# Patient Record
Sex: Male | Born: 1970 | Race: White | Hispanic: No | Marital: Married | Smoking: Current every day smoker
Health system: Southern US, Community
[De-identification: ages and names within clinical notes are randomized; demographics above are authoritative.]

## PROBLEM LIST (undated history)

## (undated) DIAGNOSIS — K635 Polyp of colon: Secondary | ICD-10-CM

## (undated) DIAGNOSIS — E7800 Pure hypercholesterolemia, unspecified: Secondary | ICD-10-CM

## (undated) DIAGNOSIS — I10 Essential (primary) hypertension: Secondary | ICD-10-CM

## (undated) HISTORY — DX: Pure hypercholesterolemia, unspecified: E78.00

## (undated) HISTORY — DX: Polyp of colon: K63.5

## (undated) HISTORY — DX: Essential (primary) hypertension: I10

---

## 2003-08-14 ENCOUNTER — Emergency Department (HOSPITAL_COMMUNITY): Admission: EM | Admit: 2003-08-14 | Discharge: 2003-08-14 | Payer: Self-pay | Admitting: Emergency Medicine

## 2004-01-13 ENCOUNTER — Encounter: Admission: RE | Admit: 2004-01-13 | Discharge: 2004-01-13 | Payer: Self-pay | Admitting: Internal Medicine

## 2005-09-07 ENCOUNTER — Encounter: Admission: RE | Admit: 2005-09-07 | Discharge: 2005-09-07 | Payer: Self-pay | Admitting: Internal Medicine

## 2007-09-20 ENCOUNTER — Encounter: Admission: RE | Admit: 2007-09-20 | Discharge: 2007-09-20 | Payer: Self-pay | Admitting: Internal Medicine

## 2011-05-06 ENCOUNTER — Emergency Department (HOSPITAL_COMMUNITY)
Admission: EM | Admit: 2011-05-06 | Discharge: 2011-05-06 | Disposition: A | Discharge status: Home / Self Care | Payer: No Typology Code available for payment source | Attending: Emergency Medicine | Admitting: Emergency Medicine

## 2011-05-06 ENCOUNTER — Emergency Department (HOSPITAL_COMMUNITY): Payer: No Typology Code available for payment source

## 2011-05-06 DIAGNOSIS — M542 Cervicalgia: Secondary | ICD-10-CM | POA: Insufficient documentation

## 2011-05-06 DIAGNOSIS — R0789 Other chest pain: Secondary | ICD-10-CM | POA: Insufficient documentation

## 2011-05-06 DIAGNOSIS — R51 Headache: Secondary | ICD-10-CM | POA: Insufficient documentation

## 2011-05-18 ENCOUNTER — Other Ambulatory Visit: Payer: Self-pay | Admitting: Internal Medicine

## 2011-05-18 DIAGNOSIS — M542 Cervicalgia: Secondary | ICD-10-CM

## 2011-05-18 DIAGNOSIS — R591 Generalized enlarged lymph nodes: Secondary | ICD-10-CM

## 2011-05-19 ENCOUNTER — Ambulatory Visit
Admission: RE | Admit: 2011-05-19 | Discharge: 2011-05-19 | Disposition: A | Discharge status: Home / Self Care | Payer: BC Managed Care – PPO | Source: Ambulatory Visit | Attending: Internal Medicine | Admitting: Internal Medicine

## 2011-05-19 DIAGNOSIS — R591 Generalized enlarged lymph nodes: Secondary | ICD-10-CM

## 2011-05-19 DIAGNOSIS — M542 Cervicalgia: Secondary | ICD-10-CM

## 2011-05-19 MED ORDER — IOHEXOL 300 MG/ML  SOLN
150.0000 mL | Freq: Once | INTRAMUSCULAR | Status: AC | PRN
  Administered 2011-05-19: 150 mL via INTRAVENOUS

## 2011-11-19 ENCOUNTER — Other Ambulatory Visit: Payer: Self-pay | Admitting: Otolaryngology

## 2011-11-24 ENCOUNTER — Ambulatory Visit
Admission: RE | Admit: 2011-11-24 | Discharge: 2011-11-24 | Disposition: A | Discharge status: Home / Self Care | Payer: BC Managed Care – PPO | Source: Ambulatory Visit | Attending: Otolaryngology | Admitting: Otolaryngology

## 2014-02-10 IMAGING — CT CT PARANASAL SINUSES LIMITED
1 series · 9 of 11 positions shown, 12 images · non-contrast
Comparison: None.

CLINICAL DATA: Nasal congestion, left frontal headache

CT LIMITED SINUSES WITHOUT CONTRAST
TECHNIQUE: Multidetector CT images of the paranasal sinuses were
obtained in a single plane without contrast.

[Series 3: cor soft · axial · 0.35mm/px · z∈[+59,+139]mm · 9 of 11 slices shown, 12 images]
[im 2/11  brain]
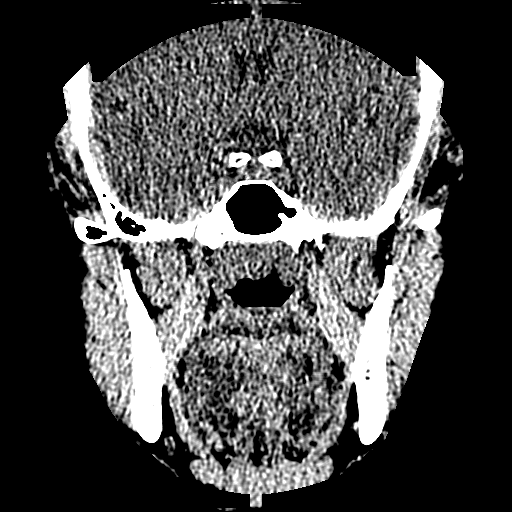
[im 2/11  bone]
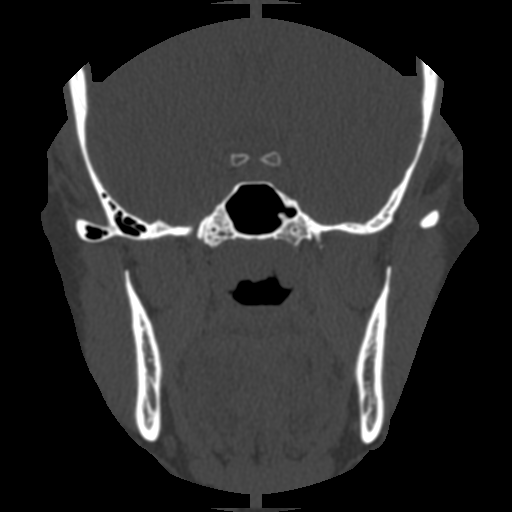
[im 3/11  bone]
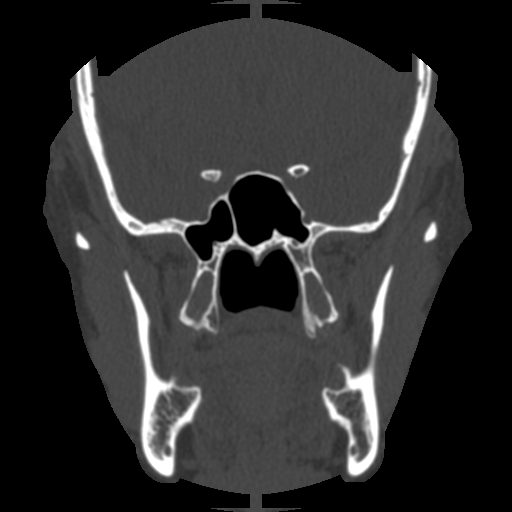
[im 4/11  bone]
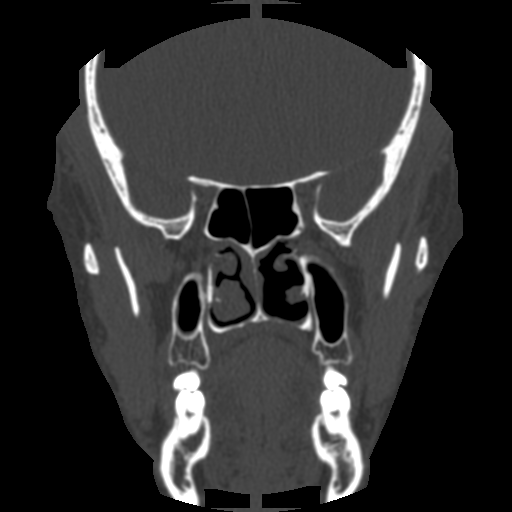
[im 5/11  bone]
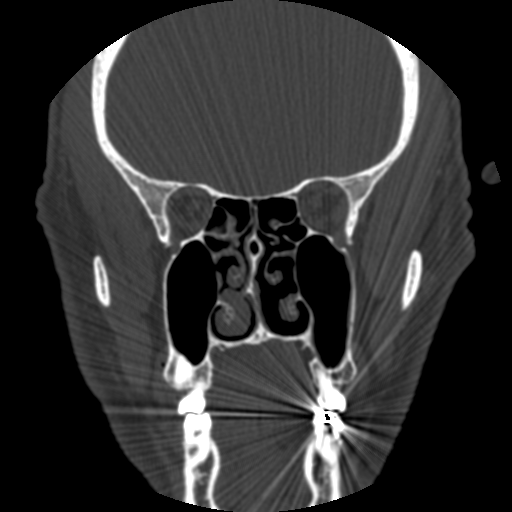
[im 6/11  brain]
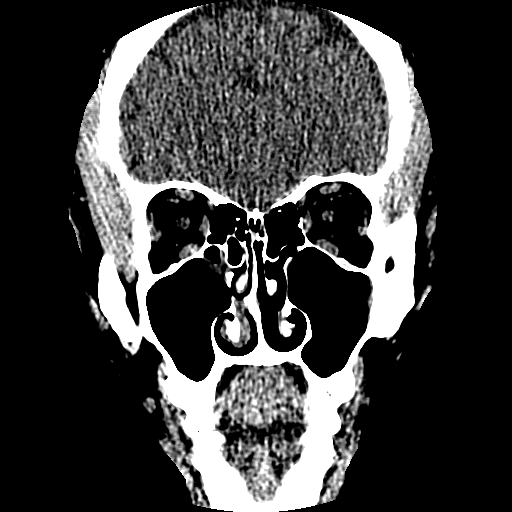
[im 6/11  bone]
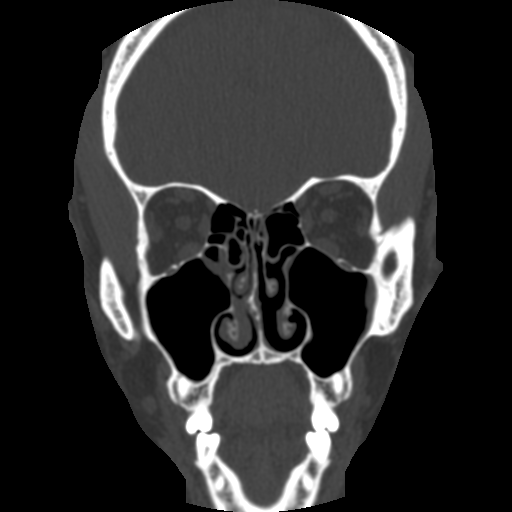
[im 7/11  bone]
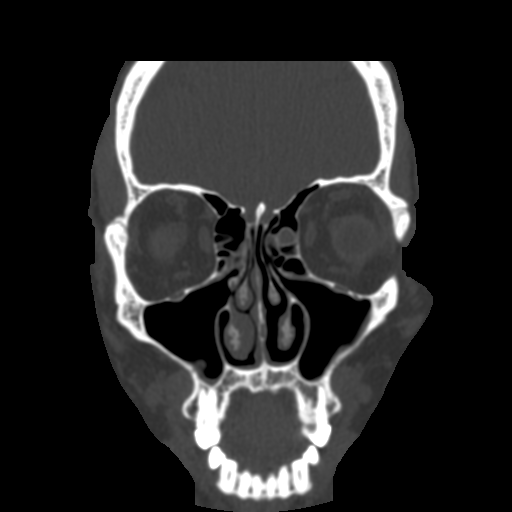
[im 8/11  bone]
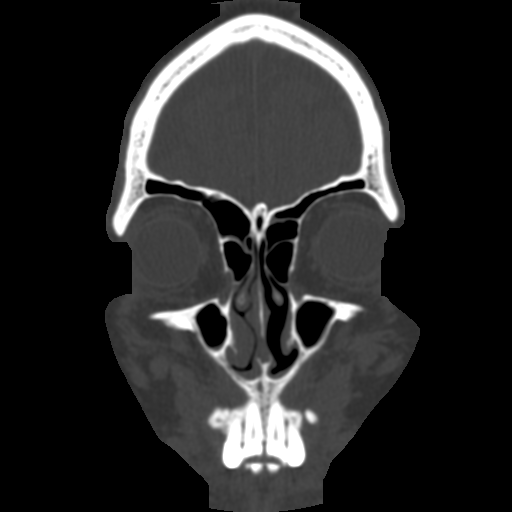
[im 9/11  bone]
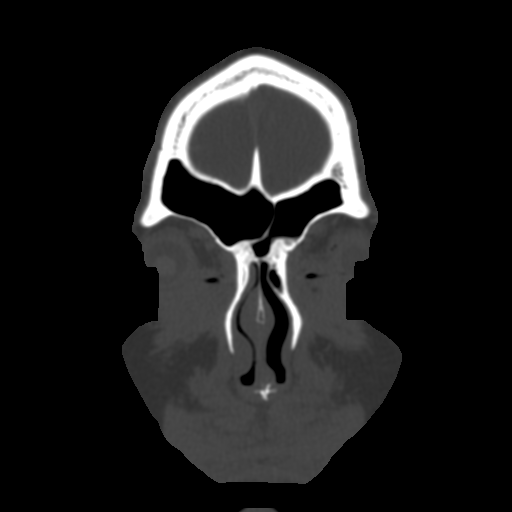
[im 10/11  brain]
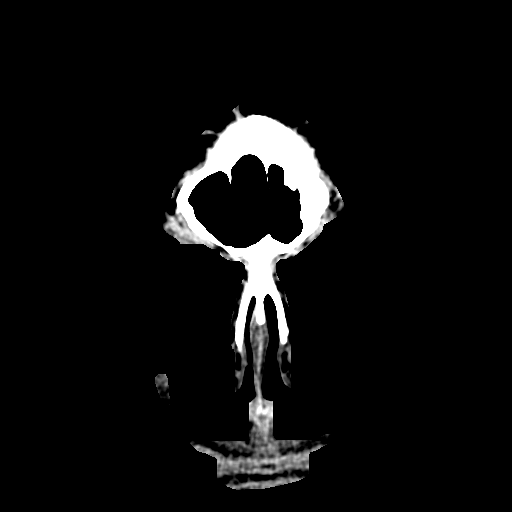
[im 10/11  bone]
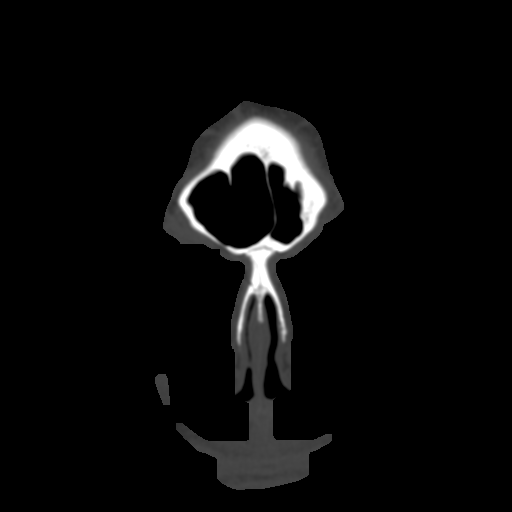

[9 of 11 positions shown; findings below may reference images not displayed]

FINDINGS: Mild mucosal edema in the maxillary and ethmoid sinuses
bilaterally.  Frontal and sphenoid sinuses are clear.  No air-fluid
level.

No acute bony change.  Slight deviation of the nasal septum to the
left.
IMPRESSION: Mild chronic sinusitis.  No air-fluid level.

## 2015-03-10 ENCOUNTER — Other Ambulatory Visit: Payer: Self-pay | Admitting: Internal Medicine

## 2015-03-10 ENCOUNTER — Ambulatory Visit
Admission: RE | Admit: 2015-03-10 | Discharge: 2015-03-10 | Disposition: A | Discharge status: Home / Self Care | Payer: BLUE CROSS/BLUE SHIELD | Source: Ambulatory Visit | Attending: Internal Medicine | Admitting: Internal Medicine

## 2015-03-10 DIAGNOSIS — M79672 Pain in left foot: Secondary | ICD-10-CM

## 2015-03-21 ENCOUNTER — Ambulatory Visit: Payer: Self-pay | Admitting: Podiatry

## 2015-03-27 ENCOUNTER — Ambulatory Visit: Payer: BLUE CROSS/BLUE SHIELD | Admitting: Podiatry

## 2017-05-27 IMAGING — CR DG FOOT 2V*L*
2 series · 2 of 2 positions shown · non-contrast
Comparison: None.

CLINICAL DATA: One year history of lateral left foot pain.

EXAM:
LEFT FOOT - 2 VIEW

[t foot ap left]
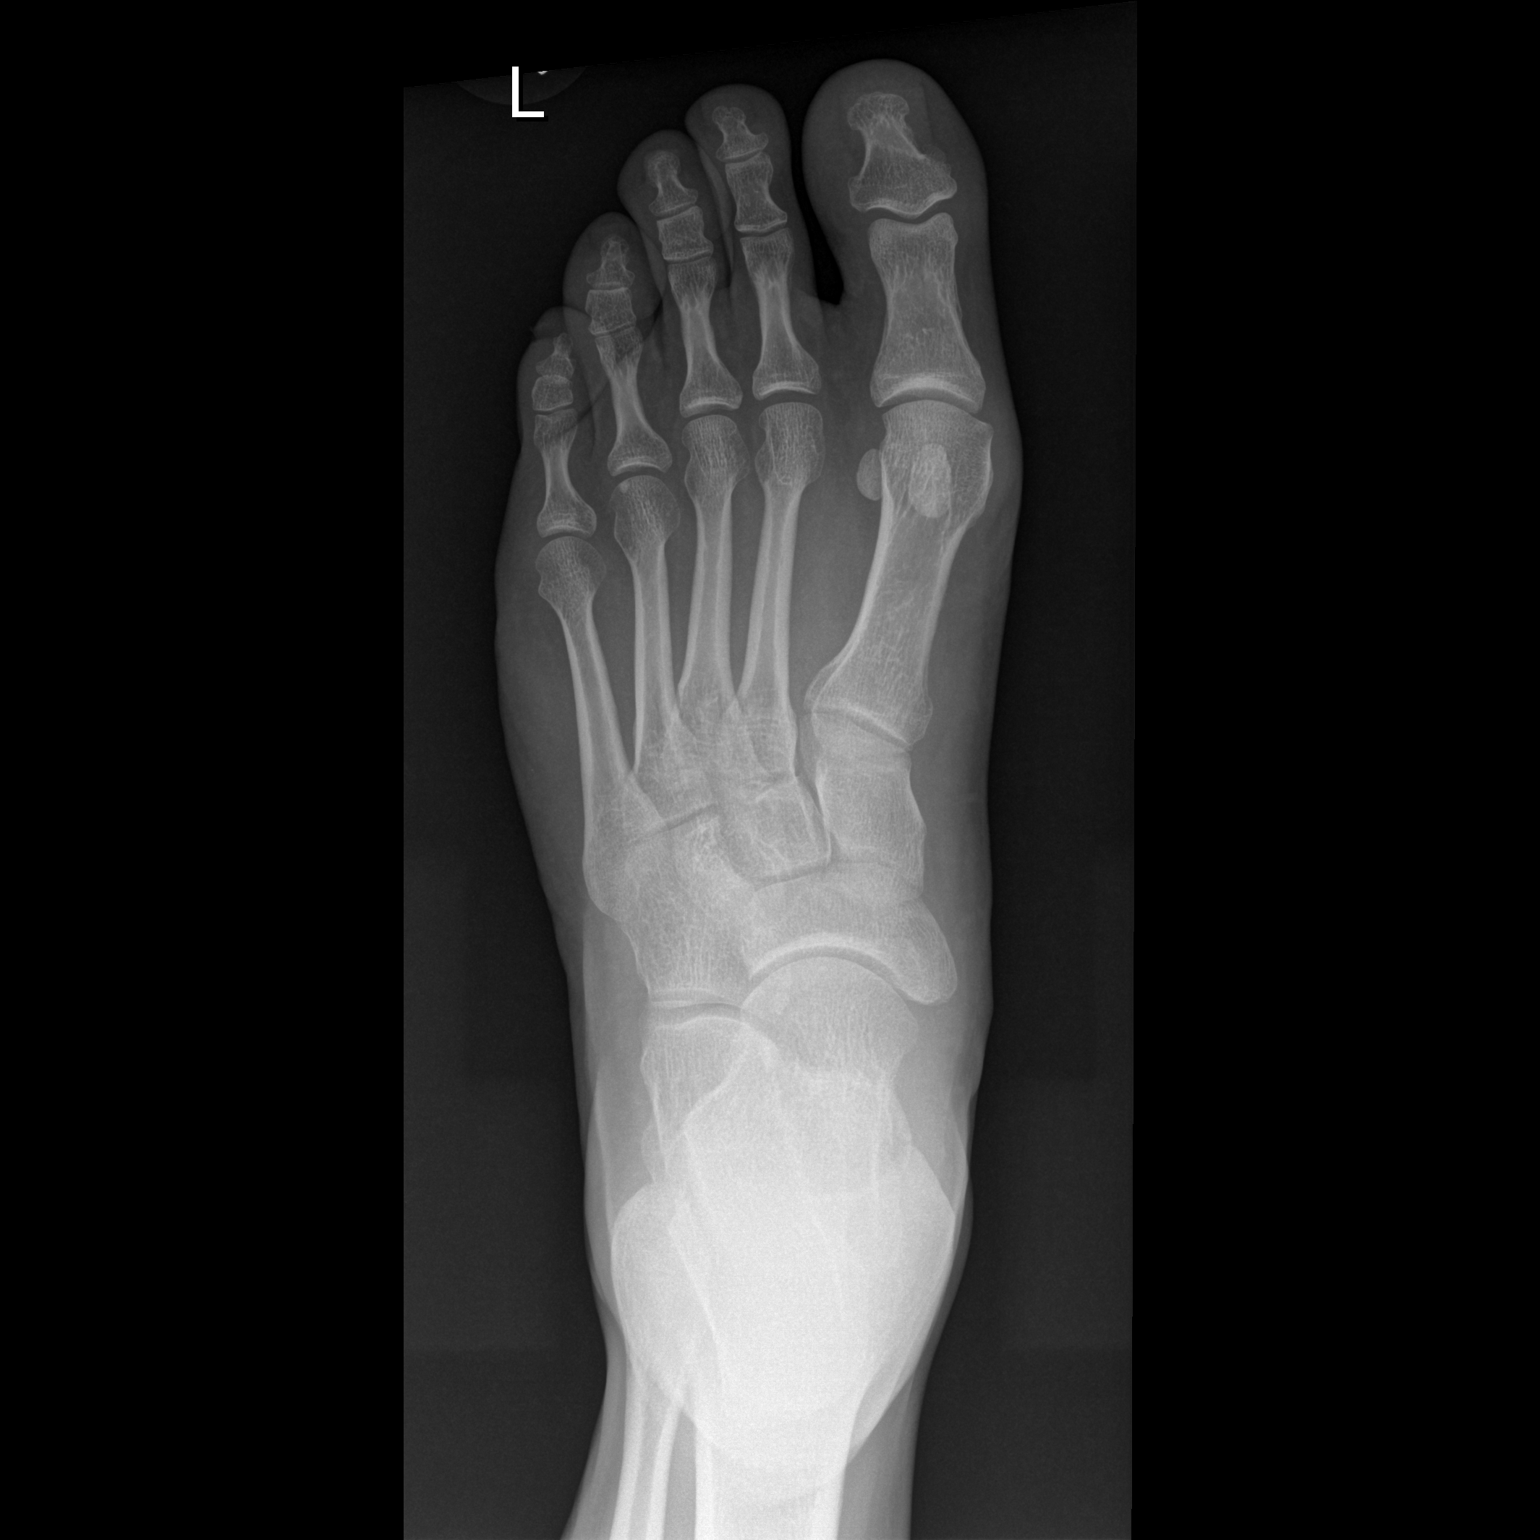

[t foot lat left]
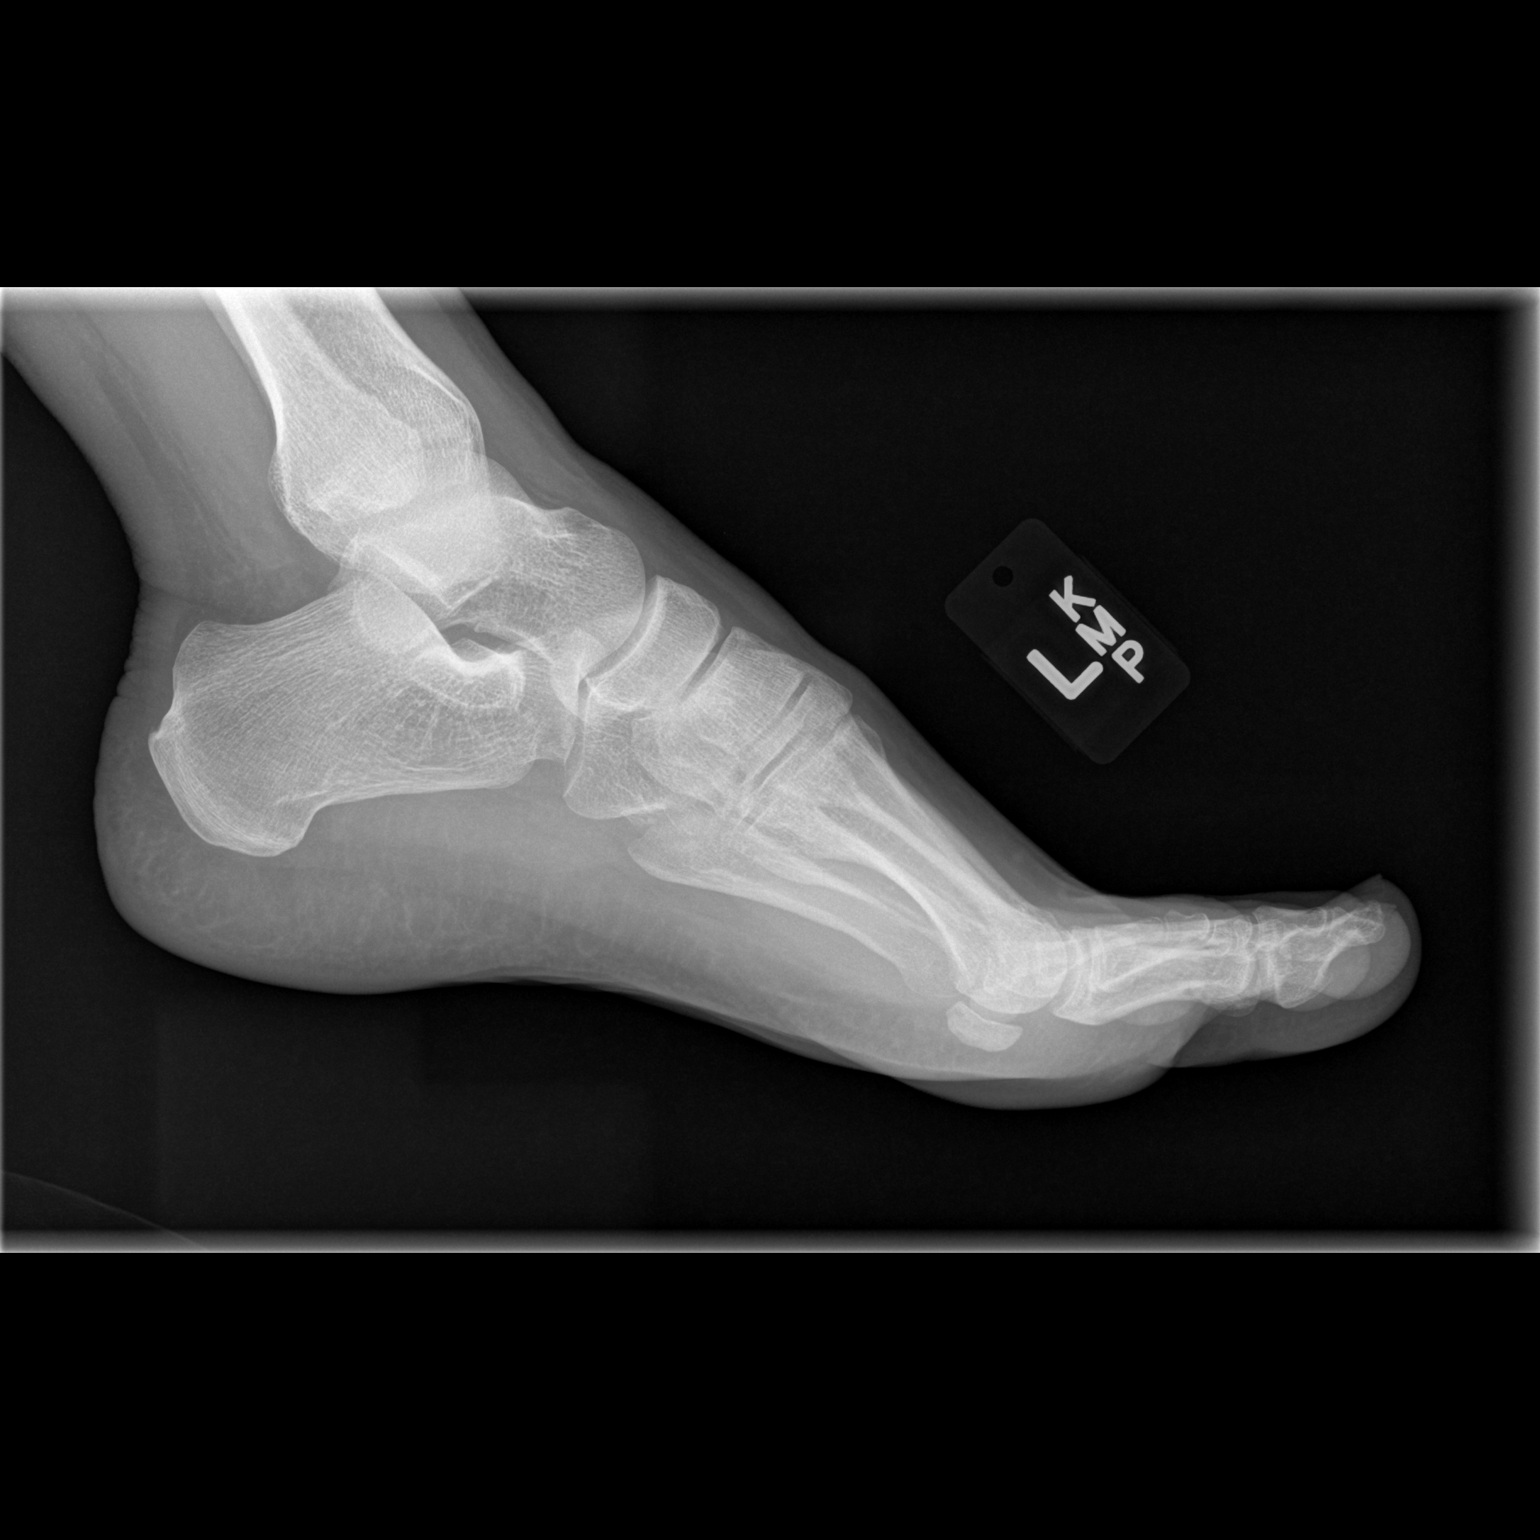

[2 of 2 positions shown; findings below may reference images not displayed]

FINDINGS: The joint spaces are maintained. No acute fracture or osteochondral
abnormality.
IMPRESSION: No acute bony findings or degenerative changes.

## 2021-12-24 LAB — HM COLONOSCOPY

## 2022-03-08 NOTE — Progress Notes (Unsigned)
   New Patient Office Visit  Subjective:  Patient ID: Cory Morgan, male    DOB: Feb 20, 1971  Age: 51 y.o. MRN: [id number]  CC: No chief complaint on file.    HPI Cory Morgan presents to establish care.   No past medical history on file.  *** The histories are not reviewed yet. Please review them in the "History" navigator section and refresh this SmartLink.  No family history on file.  Social History   Socioeconomic History   Marital status: Married    Spouse name: Not on file   Number of children: Not on file   Years of education: Not on file   Highest education level: Not on file  Occupational History   Not on file  Tobacco Use   Smoking status: Not on file   Smokeless tobacco: Not on file  Substance and Sexual Activity   Alcohol use: Not on file   Drug use: Not on file   Sexual activity: Not on file  Other Topics Concern   Not on file  Social History Narrative   Not on file   Social Determinants of Health   Financial Resource Strain: Not on file  Food Insecurity: Not on file  Transportation Needs: Not on file  Physical Activity: Not on file  Stress: Not on file  Social Connections: Not on file  Intimate Partner Violence: Not on file    ROS Review of Systems  Objective:   Today's Vitals: There were no vitals taken for this visit.  Physical Exam  Assessment & Plan:   1. Encounter to establish care ***  2. Essential hypertension ***   No outpatient encounter medications on file as of 03/09/2022.   No facility-administered encounter medications on file as of 03/09/2022.    Follow-up: No follow-ups on file.   Thayer Ohm, DNP, APRN, FNP-BC [facility]

## 2022-03-09 ENCOUNTER — Encounter: Payer: Self-pay | Admitting: Medical-Surgical

## 2022-03-09 ENCOUNTER — Ambulatory Visit (INDEPENDENT_AMBULATORY_CARE_PROVIDER_SITE_OTHER): Payer: 59

## 2022-03-09 ENCOUNTER — Ambulatory Visit (INDEPENDENT_AMBULATORY_CARE_PROVIDER_SITE_OTHER): Payer: 59 | Admitting: Medical-Surgical

## 2022-03-09 VITALS — BP 125/86 | HR 64 | Resp 20 | Ht 69.0 in | Wt 232.8 lb

## 2022-03-09 DIAGNOSIS — K219 Gastro-esophageal reflux disease without esophagitis: Secondary | ICD-10-CM

## 2022-03-09 DIAGNOSIS — Z125 Encounter for screening for malignant neoplasm of prostate: Secondary | ICD-10-CM

## 2022-03-09 DIAGNOSIS — I10 Essential (primary) hypertension: Secondary | ICD-10-CM | POA: Diagnosis not present

## 2022-03-09 DIAGNOSIS — Z7689 Persons encountering health services in other specified circumstances: Secondary | ICD-10-CM | POA: Diagnosis not present

## 2022-03-09 DIAGNOSIS — R053 Chronic cough: Secondary | ICD-10-CM

## 2022-03-09 DIAGNOSIS — Z9109 Other allergy status, other than to drugs and biological substances: Secondary | ICD-10-CM

## 2022-03-09 DIAGNOSIS — E782 Mixed hyperlipidemia: Secondary | ICD-10-CM

## 2022-03-09 DIAGNOSIS — M5431 Sciatica, right side: Secondary | ICD-10-CM

## 2022-03-09 DIAGNOSIS — Z87898 Personal history of other specified conditions: Secondary | ICD-10-CM | POA: Diagnosis not present

## 2022-03-09 DIAGNOSIS — R5383 Other fatigue: Secondary | ICD-10-CM

## 2022-03-09 DIAGNOSIS — R635 Abnormal weight gain: Secondary | ICD-10-CM

## 2022-03-09 LAB — CBC WITH DIFFERENTIAL/PLATELET
MCH: 30 pg (ref 27.0–33.0)
MPV: 11.6 fL (ref 7.5–12.5)
Monocytes Relative: 8.3 %
Platelets: 205 10*3/uL (ref 140–400)
RBC: 5.94 10*6/uL — ABNORMAL HIGH (ref 4.20–5.80)

## 2022-03-09 MED ORDER — LEVOCETIRIZINE DIHYDROCHLORIDE 5 MG PO TABS: 5.0000 mg | ORAL_TABLET | Freq: Every evening | ORAL | 1 refills | Status: DC

## 2022-03-09 MED ORDER — VALSARTAN 40 MG PO TABS: 40.0000 mg | ORAL_TABLET | Freq: Every day | ORAL | 3 refills | Status: DC

## 2022-03-09 MED ORDER — IPRATROPIUM BROMIDE 0.03 % NA SOLN: 2.0000 | Freq: Two times a day (BID) | NASAL | 0 refills | Status: DC

## 2022-03-10 LAB — TESTOSTERONE TOTAL,FREE,BIO, MALES
Albumin: 4.2 g/dL (ref 3.6–5.1)
Sex Hormone Binding: 55 nmol/L — ABNORMAL HIGH (ref 10–50)
Testosterone, Bioavailable: 123.6 ng/dL (ref 110.0–575.0)
Testosterone, Free: 64.2 pg/mL (ref 46.0–224.0)
Testosterone: 701 ng/dL (ref 250–827)

## 2022-03-10 LAB — IRON,TIBC AND FERRITIN PANEL
%SAT: 39 % (calc) (ref 20–48)
Ferritin: 105 ng/mL (ref 38–380)
Iron: 121 ug/dL (ref 50–180)
TIBC: 307 mcg/dL (calc) (ref 250–425)

## 2022-03-10 LAB — TSH: TSH: 2.32 mIU/L (ref 0.40–4.50)

## 2022-03-10 LAB — COMPLETE METABOLIC PANEL WITH GFR
AG Ratio: 1.6 (calc) (ref 1.0–2.5)
ALT: 43 U/L (ref 9–46)
AST: 30 U/L (ref 10–40)
Albumin: 4.2 g/dL (ref 3.6–5.1)
Alkaline phosphatase (APISO): 65 U/L (ref 36–130)
BUN: 13 mg/dL (ref 7–25)
CO2: 25 mmol/L (ref 20–32)
Calcium: 9.4 mg/dL (ref 8.6–10.3)
Chloride: 105 mmol/L (ref 98–110)
Creat: 0.98 mg/dL (ref 0.60–1.29)
Globulin: 2.7 g/dL (calc) (ref 1.9–3.7)
Glucose, Bld: 73 mg/dL (ref 65–139)
Potassium: 4.4 mmol/L (ref 3.5–5.3)
Sodium: 140 mmol/L (ref 135–146)
Total Bilirubin: 0.6 mg/dL (ref 0.2–1.2)
Total Protein: 6.9 g/dL (ref 6.1–8.1)
eGFR: 95 mL/min/{1.73_m2} (ref >=60)

## 2022-03-10 LAB — CBC WITH DIFFERENTIAL/PLATELET
Absolute Monocytes: 672 cells/uL (ref 200–950)
Basophils Absolute: 81 cells/uL (ref 0–200)
Basophils Relative: 1 %
Eosinophils Absolute: 624 cells/uL — ABNORMAL HIGH (ref 15–500)
Eosinophils Relative: 7.7 %
HCT: 52.1 % — ABNORMAL HIGH (ref 38.5–50.0)
Hemoglobin: 17.8 g/dL — ABNORMAL HIGH (ref 13.2–17.1)
Lymphs Abs: 2398 cells/uL (ref 850–3900)
MCHC: 34.2 g/dL (ref 32.0–36.0)
MCV: 87.7 fL (ref 80.0–100.0)
Neutro Abs: 4325 cells/uL (ref 1500–7800)
Neutrophils Relative %: 53.4 %
RDW: 13.3 % (ref 11.0–15.0)
Total Lymphocyte: 29.6 %
WBC: 8.1 10*3/uL (ref 3.8–10.8)

## 2022-03-10 LAB — HEMOGLOBIN A1C
Hgb A1c MFr Bld: 5.6 % of total Hgb (ref <5.7)
Mean Plasma Glucose: 114 mg/dL
eAG (mmol/L): 6.3 mmol/L

## 2022-03-10 LAB — VITAMIN D 25 HYDROXY (VIT D DEFICIENCY, FRACTURES): Vit D, 25-Hydroxy: 33 ng/mL (ref 30–100)

## 2022-03-10 LAB — PSA: PSA: 0.47 ng/mL (ref <=4.00)

## 2022-03-10 LAB — VITAMIN B12: Vitamin B-12: 521 pg/mL (ref 200–1100)

## 2022-03-10 LAB — LIPID PANEL
Cholesterol: 240 mg/dL — ABNORMAL HIGH (ref <200)
HDL: 54 mg/dL (ref >=40)
LDL Cholesterol (Calc): 170 mg/dL (calc) — ABNORMAL HIGH
Non-HDL Cholesterol (Calc): 186 mg/dL (calc) — ABNORMAL HIGH (ref <130)
Total CHOL/HDL Ratio: 4.4 (calc) (ref <5.0)
Triglycerides: 65 mg/dL (ref <150)

## 2022-03-22 ENCOUNTER — Encounter: Payer: Self-pay | Admitting: Medical-Surgical

## 2022-03-23 ENCOUNTER — Encounter: Payer: Self-pay | Admitting: Medical-Surgical

## 2022-03-23 ENCOUNTER — Other Ambulatory Visit: Payer: Self-pay | Admitting: Medical-Surgical

## 2022-03-23 MED ORDER — MELOXICAM 15 MG PO TABS: 15.0000 mg | ORAL_TABLET | Freq: Every day | ORAL | 0 refills | Status: DC

## 2022-04-06 MED ORDER — VALSARTAN 80 MG PO TABS: 40.0000 mg | ORAL_TABLET | Freq: Every day | ORAL | 3 refills | Status: DC

## 2022-04-16 ENCOUNTER — Other Ambulatory Visit: Payer: Self-pay | Admitting: Medical-Surgical

## 2022-04-29 ENCOUNTER — Other Ambulatory Visit: Payer: Self-pay | Admitting: Medical-Surgical

## 2022-04-29 MED ORDER — VALSARTAN 80 MG PO TABS: 80.0000 mg | ORAL_TABLET | Freq: Every day | ORAL | 3 refills | Status: DC

## 2022-04-29 NOTE — Addendum Note (Signed)
Addended bySamuel Bouche on: 04/29/2022 12:46 PM   Modules accepted: Orders

## 2022-05-03 ENCOUNTER — Encounter: Payer: Self-pay | Admitting: Medical-Surgical

## 2022-05-08 ENCOUNTER — Other Ambulatory Visit: Payer: Self-pay | Admitting: Medical-Surgical

## 2022-05-25 ENCOUNTER — Encounter: Payer: Self-pay | Admitting: Medical-Surgical

## 2022-05-25 ENCOUNTER — Ambulatory Visit (INDEPENDENT_AMBULATORY_CARE_PROVIDER_SITE_OTHER): Payer: 59 | Admitting: Medical-Surgical

## 2022-05-25 VITALS — BP 125/86 | HR 72 | Ht 69.0 in

## 2022-05-25 DIAGNOSIS — I10 Essential (primary) hypertension: Secondary | ICD-10-CM | POA: Diagnosis not present

## 2022-05-25 NOTE — Patient Instructions (Signed)
Per Samuel Bouche, NP -  possible that BP cuff could be inaccurate. Recommended that keep record of BP on either original cuff or new cuff - schedule a 2 week f/u  nurse visit and bring this cuff with you to appointment.

## 2022-05-25 NOTE — Progress Notes (Signed)
   Established Patient Office Visit  Subjective   Patient ID: Cory Morgan, male    DOB: Apr 16, 1971  Age: 51 y.o. MRN: [id number]  Chief Complaint  Patient presents with   Hypertension    Nurse visit BP check    HPI  Hypertension - nurse visit - BP check - patient denies  chest pain, shortness of breath, palpitations or dizziness.   ROS    Objective:     BP 125/86   Pulse 72   Ht 5\' 9"  (1.753 m)   SpO2 100%   BMI 34.38 kg/m    Physical Exam   No results found for any visits on 05/25/22.    The 10-year ASCVD risk score (Arnett DK, et al., 2019) is: 9.6%    Assessment & Plan:  Hypertension- blood pressure check  initial reading 136/91- second reading 125/86. Because of patient home readings being elevated -Samuel Bouche, NP -  possible that BP cuff could be inaccurate. Recommended that keep record of BP on either original cuff or new cuff - schedule a 2 week f/u  nurse visit and bring this cuff with you to appointment.  Problem List Items Addressed This Visit   None   Return in about 2 weeks (around 06/08/2022) for nurse visit - BP check .    Rae Lips, LPN

## 2022-05-25 NOTE — Progress Notes (Signed)
Agree with documentation as below.  ___________________________________________ Muneer Leider L. Aliviah Spain, DNP, APRN, FNP-BC Primary Care and Sports Medicine [facility]  

## 2022-06-02 ENCOUNTER — Ambulatory Visit: Payer: 59

## 2022-06-14 ENCOUNTER — Other Ambulatory Visit: Payer: Self-pay | Admitting: Medical-Surgical

## 2022-07-07 ENCOUNTER — Encounter: Payer: Self-pay | Admitting: Medical-Surgical

## 2022-07-07 MED ORDER — VALSARTAN 80 MG PO TABS: 80.0000 mg | ORAL_TABLET | Freq: Every day | ORAL | 1 refills | Status: DC

## 2022-07-23 ENCOUNTER — Other Ambulatory Visit: Payer: Self-pay | Admitting: Medical-Surgical

## 2022-08-23 ENCOUNTER — Other Ambulatory Visit: Payer: Self-pay | Admitting: Medical-Surgical

## 2022-09-29 ENCOUNTER — Other Ambulatory Visit: Payer: Self-pay | Admitting: Medical-Surgical

## 2022-10-15 ENCOUNTER — Other Ambulatory Visit: Payer: Self-pay | Admitting: Medical-Surgical

## 2022-10-27 ENCOUNTER — Other Ambulatory Visit: Payer: Self-pay | Admitting: Medical-Surgical

## 2022-10-28 ENCOUNTER — Other Ambulatory Visit: Payer: Self-pay | Admitting: Medical-Surgical

## 2022-11-11 ENCOUNTER — Other Ambulatory Visit: Payer: Self-pay | Admitting: Medical-Surgical

## 2022-12-21 ENCOUNTER — Other Ambulatory Visit: Payer: Self-pay | Admitting: Medical-Surgical

## 2022-12-28 ENCOUNTER — Ambulatory Visit (INDEPENDENT_AMBULATORY_CARE_PROVIDER_SITE_OTHER): Payer: 59 | Admitting: Medical-Surgical

## 2022-12-28 ENCOUNTER — Ambulatory Visit: Payer: 59 | Attending: Medical-Surgical

## 2022-12-28 ENCOUNTER — Encounter: Payer: Self-pay | Admitting: Medical-Surgical

## 2022-12-28 VITALS — BP 125/85 | HR 69 | Resp 20 | Ht 69.0 in | Wt 241.1 lb

## 2022-12-28 DIAGNOSIS — M5431 Sciatica, right side: Secondary | ICD-10-CM

## 2022-12-28 DIAGNOSIS — R42 Dizziness and giddiness: Secondary | ICD-10-CM

## 2022-12-28 DIAGNOSIS — R55 Syncope and collapse: Secondary | ICD-10-CM | POA: Diagnosis not present

## 2022-12-28 DIAGNOSIS — I10 Essential (primary) hypertension: Secondary | ICD-10-CM

## 2022-12-28 LAB — CBC WITH DIFFERENTIAL/PLATELET
HCT: 46.4 % (ref 38.5–50.0)
Hemoglobin: 16.1 g/dL (ref 13.2–17.1)
Lymphs Abs: 2487 cells/uL (ref 850–3900)
MCH: 29.9 pg (ref 27.0–33.0)
MCHC: 34.7 g/dL (ref 32.0–36.0)
MCV: 86.1 fL (ref 80.0–100.0)
MPV: 11.1 fL (ref 7.5–12.5)
Neutro Abs: 3973 cells/uL (ref 1500–7800)
Platelets: 191 10*3/uL (ref 140–400)
Total Lymphocyte: 32.3 %

## 2022-12-28 MED ORDER — MELOXICAM 15 MG PO TABS: ORAL_TABLET | ORAL | 3 refills | Status: DC

## 2022-12-28 MED ORDER — VALSARTAN 80 MG PO TABS: 80.0000 mg | ORAL_TABLET | Freq: Every day | ORAL | 3 refills | Status: DC

## 2022-12-28 NOTE — Progress Notes (Signed)
        Established patient visit  History, exam, impression, and plan:  1. Dizziness 2. Near syncope Pleasant 52 year old male presenting today with reports of 3 weeks of intermittent dizziness, fogginess, and feeling "odd".  Approximately 3 weeks ago, had [place]  He was walking over to where the kids were very dizzy and almost fell.  He did not lose consciousness and ended up sitting down on the ground for a while.  At that time, his pulse was good but they did not have a blood pressure cuff available.  He went to urgent care where his blood pressure was slightly elevated with diastolics in the 100s.  An EKG was completed there with normal sinus rhythm, rate of 84 and no acute changes.  Today he reports he has continued having these episodes although not as extensive.  After each 1, he has a dull headache.  The severity of his symptoms usually indicates how bad the headache will be there after.  These episodes are occurring daily and usually last for several seconds before spontaneously resolving.  Not associated with fever, chills, nausea, vomiting, sinus pressure, ear pain, or worsening of baseline tinnitus.  Does not seem to be associated with any particular activity or position.  No worsening of symptoms with lying down or other position changes.  Reports that the symptoms do not feel similar to previous episodes when his blood pressure was low.  On exam, neurovascularly intact.  HRRR, S1/S2 normal.  No peripheral edema.  Lungs CTA.  Respirations nonlabored.  Dix-Hallpike negative bilaterally.  Unclear etiology.  Checking labs as below.  Repeating EKG as we are unable to see the previous tracing in our system.  In office EKG normal sinus rhythm, rate 64, normal axis, no acute changes.  Plan for long-term cardiac monitor.  No previous echocardiogram so would like to update this as well as carotid ultrasounds bilaterally.  Symptoms not consistent with vertigo or  hypotensive episodes. - CBC with Differential/Platelet - COMPLETE METABOLIC PANEL WITH GFR - TSH - Iron, TIBC and Ferritin Panel - EKG 12-Lead - LONG TERM MONITOR (3-14 DAYS); Future - ECHOCARDIOGRAM COMPLETE; Future - US Carotid Bilateral; Future  3. Essential hypertension Blood pressure at goal today although recent readings at urgent care were elevated.  This was in setting of acute stress so feel that today's reading is more reliable.  Continue valsartan 80 mg daily as prescribed.  Monitor blood pressure at home with a goal of 130/80 or less.  If consistently elevated, return for further evaluation.  Low-sodium diet and get regular intentional exercise with a goal for weight loss to healthy weight.  4. Sciatica of right side Has been out of meloxicam for a while and reports that his sciatica has drastically worsened.  Refilling meloxicam 15 mg daily.   Procedures performed this visit: None.  Return if symptoms worsen or fail to improve.  __________________________________ Thayer Ohm, DNP, APRN, FNP-BC Primary Care and Sports Medicine [facility] [place]

## 2022-12-28 NOTE — Progress Notes (Unsigned)
Enrolled patient for a 7 day Zio XT monitor to be mailed to patients home   DOD to read 

## 2022-12-29 LAB — COMPLETE METABOLIC PANEL WITH GFR
AG Ratio: 1.8 (calc) (ref 1.0–2.5)
ALT: 66 U/L — ABNORMAL HIGH (ref 9–46)
AST: 33 U/L (ref 10–40)
Albumin: 4.4 g/dL (ref 3.6–5.1)
Alkaline phosphatase (APISO): 60 U/L (ref 36–130)
BUN: 20 mg/dL (ref 7–25)
CO2: 23 mmol/L (ref 20–32)
Calcium: 9.6 mg/dL (ref 8.6–10.3)
Chloride: 104 mmol/L (ref 98–110)
Creat: 1.08 mg/dL (ref 0.60–1.29)
Globulin: 2.5 g/dL (calc) (ref 1.9–3.7)
Glucose, Bld: 79 mg/dL (ref 65–139)
Potassium: 4.3 mmol/L (ref 3.5–5.3)
Sodium: 140 mmol/L (ref 135–146)
Total Bilirubin: 0.6 mg/dL (ref 0.2–1.2)
Total Protein: 6.9 g/dL (ref 6.1–8.1)
eGFR: 84 mL/min/{1.73_m2} (ref >=60)

## 2022-12-29 LAB — CBC WITH DIFFERENTIAL/PLATELET
Absolute Monocytes: 785 cells/uL (ref 200–950)
Basophils Absolute: 62 cells/uL (ref 0–200)
Basophils Relative: 0.8 %
Eosinophils Absolute: 393 cells/uL (ref 15–500)
Eosinophils Relative: 5.1 %
Monocytes Relative: 10.2 %
Neutrophils Relative %: 51.6 %
RBC: 5.39 10*6/uL (ref 4.20–5.80)
RDW: 13.2 % (ref 11.0–15.0)
WBC: 7.7 10*3/uL (ref 3.8–10.8)

## 2022-12-29 LAB — IRON,TIBC AND FERRITIN PANEL
%SAT: 46 % (calc) (ref 20–48)
Ferritin: 202 ng/mL (ref 38–380)
Iron: 129 ug/dL (ref 50–180)
TIBC: 283 mcg/dL (calc) (ref 250–425)

## 2022-12-29 LAB — TSH: TSH: 2.76 mIU/L (ref 0.40–4.50)

## 2022-12-31 DIAGNOSIS — R55 Syncope and collapse: Secondary | ICD-10-CM

## 2023-01-07 ENCOUNTER — Encounter (HOSPITAL_COMMUNITY): Payer: Self-pay

## 2023-02-07 ENCOUNTER — Ambulatory Visit (HOSPITAL_COMMUNITY): Payer: 59

## 2023-02-28 ENCOUNTER — Other Ambulatory Visit (HOSPITAL_COMMUNITY): Payer: 59

## 2023-03-08 ENCOUNTER — Other Ambulatory Visit (HOSPITAL_COMMUNITY): Payer: 59

## 2023-03-18 ENCOUNTER — Ambulatory Visit (HOSPITAL_COMMUNITY): Payer: 59 | Attending: Medical-Surgical

## 2023-03-18 DIAGNOSIS — R55 Syncope and collapse: Secondary | ICD-10-CM

## 2023-03-18 LAB — ECHOCARDIOGRAM COMPLETE
Area-P 1/2: 3.99 cm2
S' Lateral: 3.2 cm

## 2024-01-22 ENCOUNTER — Other Ambulatory Visit: Payer: Self-pay | Admitting: Medical-Surgical

## 2024-01-23 NOTE — Telephone Encounter (Signed)
 Please advise on refill request

## 2024-02-21 ENCOUNTER — Other Ambulatory Visit: Payer: Self-pay | Admitting: Medical-Surgical

## 2024-03-08 ENCOUNTER — Other Ambulatory Visit: Payer: Self-pay | Admitting: Medical-Surgical

## 2024-03-10 ENCOUNTER — Other Ambulatory Visit: Payer: Self-pay | Admitting: Medical-Surgical

## 2024-03-15 ENCOUNTER — Other Ambulatory Visit: Payer: Self-pay | Admitting: Medical-Surgical

## 2024-06-06 ENCOUNTER — Other Ambulatory Visit: Payer: Self-pay | Admitting: Medical-Surgical

## 2024-09-04 ENCOUNTER — Other Ambulatory Visit: Payer: Self-pay | Admitting: Medical-Surgical
